# Patient Record
Sex: Male | Born: 1985 | Hispanic: No | Marital: Married | Smoking: Current every day smoker
Health system: Southern US, Community
[De-identification: ages and names within clinical notes are randomized; demographics above are authoritative.]

## PROBLEM LIST (undated history)

## (undated) DIAGNOSIS — K589 Irritable bowel syndrome without diarrhea: Secondary | ICD-10-CM

---

## 2013-02-10 ENCOUNTER — Emergency Department (HOSPITAL_COMMUNITY): Payer: Self-pay

## 2013-02-10 ENCOUNTER — Emergency Department (HOSPITAL_COMMUNITY)
Admission: EM | Admit: 2013-02-10 | Discharge: 2013-02-10 | Disposition: A | Discharge status: Home / Self Care | Payer: Self-pay | Attending: Emergency Medicine | Admitting: Emergency Medicine

## 2013-02-10 ENCOUNTER — Encounter (HOSPITAL_COMMUNITY): Payer: Self-pay | Admitting: Adult Health

## 2013-02-10 DIAGNOSIS — F17200 Nicotine dependence, unspecified, uncomplicated: Secondary | ICD-10-CM | POA: Insufficient documentation

## 2013-02-10 DIAGNOSIS — S8262XA Displaced fracture of lateral malleolus of left fibula, initial encounter for closed fracture: Secondary | ICD-10-CM

## 2013-02-10 DIAGNOSIS — X500XXA Overexertion from strenuous movement or load, initial encounter: Secondary | ICD-10-CM | POA: Insufficient documentation

## 2013-02-10 DIAGNOSIS — Y9289 Other specified places as the place of occurrence of the external cause: Secondary | ICD-10-CM | POA: Insufficient documentation

## 2013-02-10 DIAGNOSIS — Z8719 Personal history of other diseases of the digestive system: Secondary | ICD-10-CM | POA: Insufficient documentation

## 2013-02-10 DIAGNOSIS — Y9389 Activity, other specified: Secondary | ICD-10-CM | POA: Insufficient documentation

## 2013-02-10 DIAGNOSIS — S8263XA Displaced fracture of lateral malleolus of unspecified fibula, initial encounter for closed fracture: Secondary | ICD-10-CM | POA: Insufficient documentation

## 2013-02-10 HISTORY — DX: Irritable bowel syndrome without diarrhea: K58.9

## 2013-02-10 MED ORDER — HYDROCODONE-ACETAMINOPHEN 5-325 MG PO TABS: 1.0000 | ORAL_TABLET | ORAL | Status: DC | PRN

## 2013-02-10 MED ORDER — IBUPROFEN 600 MG PO TABS: 600.0000 mg | ORAL_TABLET | Freq: Four times a day (QID) | ORAL | Status: DC | PRN

## 2013-02-10 MED ORDER — HYDROCODONE-ACETAMINOPHEN 5-325 MG PO TABS
1.0000 | ORAL_TABLET | Freq: Once | ORAL | Status: AC
  Administered 2013-02-10: 1 via ORAL
  Filled 2013-02-10: qty 1

## 2013-02-10 MED ORDER — IBUPROFEN 400 MG PO TABS
800.0000 mg | ORAL_TABLET | Freq: Once | ORAL | Status: AC
  Administered 2013-02-10: 800 mg via ORAL
  Filled 2013-02-10: qty 2

## 2013-02-10 NOTE — ED Notes (Addendum)
Presents with lwft ankle injury twisted it while taking out garbage and heard a "pop" ;eft ankle edematous, CMS intact. Brisk cap refill, can wiggle digits, +2 pedal pulse. Pain is worse with movement

## 2013-02-10 NOTE — ED Notes (Signed)
Pt discharged.Vital signs stable and GCS 15 

## 2013-02-10 NOTE — Progress Notes (Signed)
Orthopedic Tech Progress Note Patient Details:  Tom Cooper 1986-06-08 [id number]  Ortho Devices Type of Ortho Device: Ace wrap;Crutches;Post (short leg) splint;Stirrup splint Ortho Device/Splint Location: left leg Ortho Device/Splint Interventions: Application   Tarl Cephas 02/10/2013, 7:53 PM

## 2013-02-10 NOTE — ED Provider Notes (Signed)
History    This chart was scribed for non-physician practitioner working with Tom Sou, MD by Sofie Rower, ED Scribe. This patient was seen in room TR10C/TR10C and the patient's care was started at 5:27PM.   CSN: [id number]  Arrival date & time 02/10/13  1648   First MD Initiated Contact with Patient 02/10/13 1727      Chief Complaint  Patient presents with  . Ankle Injury    (Consider location/radiation/quality/duration/timing/severity/associated sxs/prior treatment) The history is provided by the patient and the spouse. No language interpreter was used.    Tom Cooper is a 27 y.o. male , with a hx of IBS, who presents to the Emergency Department complaining of sudden, progressively worsening, non radiating, ankle injury, located at the left ankle, onset today (02/10/13 @ 11:00AM). The pt reports he was taking out the garbage earlier this morning when he suddenly experienced a popping, painful sensation within his left ankle. Modifying factors include certain movements and positions of the left ankle, in addition to ambulation  which intensifies the ankle pain. The pt has not taken any medications to relieve his ankle pain PTA.   The pt denies any other injuries at the present point and time.   The pt is a current everyday smoker, however, he does not drink alcohol.    Past Medical History  Diagnosis Date  . IBS (irritable bowel syndrome)     History reviewed. No pertinent past surgical history.  History reviewed. No pertinent family history.  History  Substance Use Topics  . Smoking status: Current Every Day Smoker    Types: Cigarettes  . Smokeless tobacco: Not on file  . Alcohol Use: No      Review of Systems  Musculoskeletal: Positive for arthralgias.  All other systems reviewed and are negative.    Allergies  Review of patient's allergies indicates no known allergies.  Home Medications   Current Outpatient Rx  Name  Route  Sig  Dispense  Refill  .  HYDROcodone-acetaminophen (NORCO/VICODIN) 5-325 MG per tablet   Oral   Take 1 tablet by mouth every 4 (four) hours as needed for pain.   20 tablet   0   . ibuprofen (ADVIL,MOTRIN) 600 MG tablet   Oral   Take 1 tablet (600 mg total) by mouth every 6 (six) hours as needed for pain.   30 tablet   0     BP 137/94  Pulse 110  Temp(Src) 99.2 F (37.3 C) (Oral)  Resp 16  SpO2 97%  Physical Exam  Nursing note and vitals reviewed. Constitutional: He is oriented to person, place, and time. He appears well-developed and well-nourished. No distress.  HENT:  Head: Normocephalic and atraumatic.  Eyes: EOM are normal.  Neck: Neck supple. No tracheal deviation present.  Cardiovascular: Normal rate.   Pulmonary/Chest: Effort normal. No respiratory distress.  Musculoskeletal:       Left ankle: He exhibits decreased range of motion, swelling and ecchymosis. He exhibits no deformity, no laceration and normal pulse. Tenderness. Lateral malleolus tenderness found. No medial malleolus tenderness found. Achilles tendon normal.  Neurological: He is alert and oriented to person, place, and time.  Skin: Skin is warm and dry.  Psychiatric: He has a normal mood and affect. His behavior is normal.    ED Course  Procedures (including critical care time)  DIAGNOSTIC STUDIES: Oxygen Saturation is 97% on room air, normal by my interpretation.    COORDINATION OF CARE:  5:47 PM- Treatment plan discussed with patient.  Pt agrees with treatment.   ED Medications this visit (02/10/13):  Medications  ibuprofen (ADVIL,MOTRIN) tablet 800 mg (800 mg Oral Given 02/10/13 1959)  HYDROcodone-acetaminophen (NORCO/VICODIN) 5-325 MG per tablet 1 tablet (1 tablet Oral Given 02/10/13 1959)    Other ED Orders:   02/10/13 1902  Apply splint short leg Once Discontinue Comments: With stirrups  Hervey Ard, [name] 02/10/13 1902  Crutches Once Discontinue  Ordered [name]   Labs Reviewed - No data to  display  No results found for this or any previous visit. Dg Ankle Complete Left  02/10/2013  *RADIOLOGY REPORT*  Clinical Data: Twisting ankle injury.  Lateral pain and swelling.  LEFT ANKLE COMPLETE - 3+ VIEW  Comparison: None.  Findings: Considerable abnormal soft tissue swelling overlies the lateral malleolus.  Suspected avulsion along the inferior tip of the lateral malleolus.  Tibial plafond and talar dome appear intact.  Os trigonum noted with mild irregularity along the talar side of the posterior subtalar facet, and potentially slight widening of the posterior subtalar facet.  IMPRESSION:  1.  Abnormal soft tissue swelling overlying the lateral malleolus, with suspicion for a small avulsion from the inferior tip of the lateral malleolus. 2.  Irregularity along the talar side of the posterior subtalar joint may simply be projectional, but I cannot completely exclude an injury of the posterior subtalar joint.  Consider CT or MRI for further characterization.   Original Report Authenticated By: Gaylyn Rong, M.D.    Discharge orders:    Medication List    TAKE these medications       HYDROcodone-acetaminophen 5-325 MG per tablet  Commonly known as:  NORCO/VICODIN  Take 1 tablet by mouth every 4 (four) hours as needed for pain.     ibuprofen 600 MG tablet  Commonly known as:  ADVIL,MOTRIN  Take 1 tablet (600 mg total) by mouth every 6 (six) hours as needed for pain.         1. Ankle fracture, lateral malleolus, closed, left, initial encounter       MDM    Pt has been advised of the symptoms that warrant their return to the ED. Patient has voiced understanding and has agreed to follow-up with the PCP or specialist.    I personally performed the services described in this documentation, which was scribed in my presence. The recorded information has been reviewed and is accurate.   Dorthula Matas, PA-C 02/10/13 2355

## 2013-02-11 NOTE — ED Provider Notes (Signed)
Medical screening examination/treatment/procedure(s) were performed by non-physician practitioner and as supervising physician I was immediately available for consultation/collaboration.  Doug Sou, MD 02/11/13 [phone]

## 2013-02-15 ENCOUNTER — Encounter (HOSPITAL_COMMUNITY): Payer: Self-pay | Admitting: Emergency Medicine

## 2013-02-15 ENCOUNTER — Emergency Department (HOSPITAL_COMMUNITY): Payer: Self-pay

## 2013-02-15 ENCOUNTER — Emergency Department (HOSPITAL_COMMUNITY)
Admission: EM | Admit: 2013-02-15 | Discharge: 2013-02-15 | Disposition: A | Discharge status: Home / Self Care | Payer: Self-pay | Attending: Emergency Medicine | Admitting: Emergency Medicine

## 2013-02-15 DIAGNOSIS — Z8719 Personal history of other diseases of the digestive system: Secondary | ICD-10-CM | POA: Insufficient documentation

## 2013-02-15 DIAGNOSIS — Y929 Unspecified place or not applicable: Secondary | ICD-10-CM | POA: Insufficient documentation

## 2013-02-15 DIAGNOSIS — Z8781 Personal history of (healed) traumatic fracture: Secondary | ICD-10-CM | POA: Insufficient documentation

## 2013-02-15 DIAGNOSIS — X58XXXA Exposure to other specified factors, initial encounter: Secondary | ICD-10-CM | POA: Insufficient documentation

## 2013-02-15 DIAGNOSIS — S93402D Sprain of unspecified ligament of left ankle, subsequent encounter: Secondary | ICD-10-CM

## 2013-02-15 DIAGNOSIS — F17200 Nicotine dependence, unspecified, uncomplicated: Secondary | ICD-10-CM | POA: Insufficient documentation

## 2013-02-15 DIAGNOSIS — S93409A Sprain of unspecified ligament of unspecified ankle, initial encounter: Secondary | ICD-10-CM | POA: Insufficient documentation

## 2013-02-15 DIAGNOSIS — Y939 Activity, unspecified: Secondary | ICD-10-CM | POA: Insufficient documentation

## 2013-02-15 NOTE — ED Notes (Signed)
Patient states that he came into the ED on Wednesday for the same.  Patient was referred out to an orthopedic MD, but the referred MD's office advised the patient that they could not see patient without a $250.00 deposit.   Patient called back to ED and asked what he needed to do to follow up.  He was advised to come back to the ED.

## 2013-02-15 NOTE — ED Provider Notes (Signed)
History     CSN: [id number]  Arrival date & time 02/15/13  1251   First MD Initiated Contact with Patient 02/15/13 1307      Chief Complaint  Patient presents with  . Ankle Pain    (Consider location/radiation/quality/duration/timing/severity/associated sxs/prior treatment) HPI Comments: Patient presents today for follow up.  He was diagnosed with a possible avulsion fracture of the left lateral malleolus on 02/10/13.  He has a posterior splint placed at that time and was given referral to the Orthopedist on call.  Patient reports that he attempted to call the Orthopedist on call, but was told that he would required to pay $250 up front, which he does not have.  He denies any pain at this time.  He denies numbness or tingling.  He has not taken anything for pain recently.  Patient is a 27 y.o. male presenting with ankle pain. The history is provided by the patient.  Ankle Pain   Past Medical History  Diagnosis Date  . IBS (irritable bowel syndrome)     History reviewed. No pertinent past surgical history.  History reviewed. No pertinent family history.  History  Substance Use Topics  . Smoking status: Current Every Day Smoker    Types: Cigarettes  . Smokeless tobacco: Not on file  . Alcohol Use: No      Review of Systems  Musculoskeletal:       Denies ankle pain at this time.  Neurological: Negative for numbness.    Allergies  Review of patient's allergies indicates no known allergies.  Home Medications   Current Outpatient Rx  Name  Route  Sig  Dispense  Refill  . ibuprofen (ADVIL,MOTRIN) 200 MG tablet   Oral   Take 400 mg by mouth every 6 (six) hours as needed for pain.         Marland Kitchen HYDROcodone-acetaminophen (NORCO/VICODIN) 5-325 MG per tablet   Oral   Take 1 tablet by mouth every 4 (four) hours as needed for pain.   20 tablet   0   . ibuprofen (ADVIL,MOTRIN) 600 MG tablet   Oral   Take 1 tablet (600 mg total) by mouth every 6 (six) hours as needed for  pain.   30 tablet   0     BP 143/88  Pulse 107  Temp(Src) 98.5 F (36.9 C) (Oral)  Resp 18  SpO2 98%  Physical Exam  Nursing note and vitals reviewed. Constitutional: He appears well-developed and well-nourished.  HENT:  Head: Normocephalic and atraumatic.  Neck: Normal range of motion. Neck supple.  Cardiovascular: Normal rate, regular rhythm and normal heart sounds.   Pulmonary/Chest: Effort normal and breath sounds normal.  Musculoskeletal:  Posterior splint of the left lower leg appears to be intact.    Neurological: He is alert.  Distal sensation of the left toes intact.  Skin: Skin is warm and dry.  Psychiatric: He has a normal mood and affect.    ED Course  Procedures (including critical care time)  Labs Reviewed - No data to display Dg Ankle Complete Left  02/15/2013  *RADIOLOGY REPORT*  Clinical Data: Left ankle pain  LEFT ANKLE COMPLETE - 3+ VIEW  Comparison: 02/10/2013  Findings: Three views of the left ankle submitted.  Study is limited by casting material artifact.  No visible fracture or subluxation.  Ankle mortise is preserved.  IMPRESSION: Limited study by casting material artifact.  Ankle mortise is preserved.  No visible fracture or subluxation.   Original Report Authenticated By: Lang Snow  Pop, M.D.      No diagnosis found.   Patient discussed with Dr. Ranae Palms. He recommends ordering another xray and reassessing  Discussed xray results with Dr. Ranae Palms.  He recommends removing the splint.  Ankle reexamined after splint removal.  He has full ROM of the ankle.  No swelling or obvious deformity.  Patient able to ambulate on left foot without pain after splint removed.  MDM  Patient presenting due to inability to follow up with the Orthopedist.  He was diagnosed with an avulsion fracture of the left ankle five days ago.  Posterior splint with stirrups was applied at that time and patient given referral to Orthopedics.  He was unable to follow up with  orthopedist for financial reasons.  He is not complaining of any pain at this time.  Splint removed and patient was able to ambulate without pain.  Therefore, feel that patient is stable for discharge.        Pascal Lux Britton, PA-C 02/16/13 [phone]

## 2013-02-15 NOTE — ED Notes (Signed)
Pt here after having fracture in left ankle last Wednesday; pt sts told to follow up with ortho but ortho referral said they would not see pt without money first and they did not have money so came back here for recheck

## 2013-02-15 NOTE — Progress Notes (Signed)
Orthopedic Tech Progress Note Patient Details:  Tom Cooper Jan 26, 1986 [id number]  Ortho Devices Type of Ortho Device: ASO Ortho Device/Splint Location: left ankle Ortho Device/Splint Interventions: Application   Laurel Harnden 02/15/2013, 4:19 PM

## 2013-02-17 NOTE — ED Provider Notes (Signed)
Medical screening examination/treatment/procedure(s) were performed by non-physician practitioner and as supervising physician I was immediately available for consultation/collaboration.   Loren Racer, MD 02/17/13 [phone]

## 2013-11-22 ENCOUNTER — Emergency Department (HOSPITAL_COMMUNITY): Payer: Self-pay

## 2013-11-22 ENCOUNTER — Encounter (HOSPITAL_COMMUNITY): Payer: Self-pay | Admitting: Emergency Medicine

## 2013-11-22 ENCOUNTER — Emergency Department (HOSPITAL_COMMUNITY)
Admission: EM | Admit: 2013-11-22 | Discharge: 2013-11-22 | Disposition: A | Discharge status: Home / Self Care | Payer: Self-pay | Attending: Emergency Medicine | Admitting: Emergency Medicine

## 2013-11-22 DIAGNOSIS — Z8719 Personal history of other diseases of the digestive system: Secondary | ICD-10-CM | POA: Insufficient documentation

## 2013-11-22 DIAGNOSIS — F17200 Nicotine dependence, unspecified, uncomplicated: Secondary | ICD-10-CM | POA: Insufficient documentation

## 2013-11-22 DIAGNOSIS — R0789 Other chest pain: Secondary | ICD-10-CM | POA: Insufficient documentation

## 2013-11-22 DIAGNOSIS — J209 Acute bronchitis, unspecified: Secondary | ICD-10-CM

## 2013-11-22 DIAGNOSIS — R51 Headache: Secondary | ICD-10-CM | POA: Insufficient documentation

## 2013-11-22 DIAGNOSIS — J40 Bronchitis, not specified as acute or chronic: Secondary | ICD-10-CM | POA: Insufficient documentation

## 2013-11-22 LAB — CBC WITH DIFFERENTIAL/PLATELET
BASOS PCT: 1 % (ref 0–1)
Basophils Absolute: 0.1 10*3/uL (ref 0.0–0.1)
EOS ABS: 0.3 10*3/uL (ref 0.0–0.7)
Eosinophils Relative: 4 % (ref 0–5)
HEMATOCRIT: 46.1 % (ref 39.0–52.0)
HEMOGLOBIN: 16.1 g/dL (ref 13.0–17.0)
LYMPHS ABS: 2.3 10*3/uL (ref 0.7–4.0)
Lymphocytes Relative: 31 % (ref 12–46)
MCH: 31 pg (ref 26.0–34.0)
MCHC: 34.9 g/dL (ref 30.0–36.0)
MCV: 88.7 fL (ref 78.0–100.0)
MONO ABS: 0.4 10*3/uL (ref 0.1–1.0)
MONOS PCT: 5 % (ref 3–12)
NEUTROS ABS: 4.5 10*3/uL (ref 1.7–7.7)
Neutrophils Relative %: 60 % (ref 43–77)
Platelets: 212 10*3/uL (ref 150–400)
RBC: 5.2 MIL/uL (ref 4.22–5.81)
RDW: 12 % (ref 11.5–15.5)
WBC: 7.6 10*3/uL (ref 4.0–10.5)

## 2013-11-22 LAB — BASIC METABOLIC PANEL
BUN: 14 mg/dL (ref 6–23)
CHLORIDE: 105 meq/L (ref 96–112)
CO2: 24 mEq/L (ref 19–32)
Calcium: 9.1 mg/dL (ref 8.4–10.5)
Creatinine, Ser: 0.62 mg/dL (ref 0.50–1.35)
GFR calc Af Amer: >90 mL/min (ref >90)
GFR calc non Af Amer: >90 mL/min (ref >90)
GLUCOSE: 101 mg/dL — AB (ref 70–99)
POTASSIUM: 4.4 meq/L (ref 3.7–5.3)
Sodium: 141 mEq/L (ref 137–147)

## 2013-11-22 LAB — RAPID STREP SCREEN (MED CTR MEBANE ONLY): Streptococcus, Group A Screen (Direct): NEGATIVE

## 2013-11-22 MED ORDER — METHYLPREDNISOLONE SODIUM SUCC 125 MG IJ SOLR
125.0000 mg | Freq: Once | INTRAMUSCULAR | Status: AC
  Administered 2013-11-22: 125 mg via INTRAVENOUS
  Filled 2013-11-22: qty 2

## 2013-11-22 MED ORDER — ALBUTEROL SULFATE HFA 108 (90 BASE) MCG/ACT IN AERS
1.0000 | INHALATION_SPRAY | RESPIRATORY_TRACT | Status: DC | PRN
  Administered 2013-11-22: 2 via RESPIRATORY_TRACT
  Filled 2013-11-22: qty 6.7

## 2013-11-22 MED ORDER — ALBUTEROL SULFATE (2.5 MG/3ML) 0.083% IN NEBU
5.0000 mg | INHALATION_SOLUTION | RESPIRATORY_TRACT | Status: DC | PRN
  Administered 2013-11-22: 5 mg via RESPIRATORY_TRACT
  Filled 2013-11-22: qty 6

## 2013-11-22 MED ORDER — PREDNISONE 50 MG PO TABS: 50.0000 mg | ORAL_TABLET | Freq: Every day | ORAL | Status: AC

## 2013-11-22 MED ORDER — IPRATROPIUM BROMIDE 0.02 % IN SOLN
0.5000 mg | Freq: Once | RESPIRATORY_TRACT | Status: AC
  Administered 2013-11-22: 0.5 mg via RESPIRATORY_TRACT
  Filled 2013-11-22: qty 2.5

## 2013-11-22 NOTE — ED Notes (Signed)
Pt. Is not noted in the room. 

## 2013-11-22 NOTE — Discharge Planning (Signed)
[facility] Felicia E, Community Liaison ° °Spoke to patient about primary care resources, resource guide given. °

## 2013-11-22 NOTE — ED Notes (Signed)
Pt. Is gone to xray.

## 2013-11-22 NOTE — Discharge Instructions (Signed)
Antibiotic Nonuse ° Your caregiver felt that the infection or problem was not one that would be helped with an antibiotic. °Infections may be caused by viruses or bacteria. Only a caregiver can tell which one of these is the likely cause of an illness. A cold is the most common cause of infection in both adults and children. A cold is a virus. Antibiotic treatment will have no effect on a viral infection. Viruses can lead to many lost days of work caring for sick children and many missed days of school. Children may catch as many as 10 "colds" or "flus" per year during which they can be tearful, cranky, and uncomfortable. The goal of treating a virus is aimed at keeping the ill person comfortable. °Antibiotics are medications used to help the body fight bacterial infections. There are relatively few types of bacteria that cause infections but there are hundreds of viruses. While both viruses and bacteria cause infection they are very different types of germs. A viral infection will typically go away by itself within 7 to 10 days. Bacterial infections may spread or get worse without antibiotic treatment. °Examples of bacterial infections are: °· Sore throats (like strep throat or tonsillitis). °· Infection in the lung (pneumonia). °· Ear and skin infections. °Examples of viral infections are: °· Colds or flus. °· Most coughs and bronchitis. °· Sore throats not caused by Strep. °· Runny noses. °It is often best not to take an antibiotic when a viral infection is the cause of the problem. Antibiotics can kill off the helpful bacteria that we have inside our body and allow harmful bacteria to start growing. Antibiotics can cause side effects such as allergies, nausea, and diarrhea without helping to improve the symptoms of the viral infection. Additionally, repeated uses of antibiotics can cause bacteria inside of our body to become resistant. That resistance can be passed onto harmful bacterial. The next time you have  an infection it may be harder to treat if antibiotics are used when they are not needed. Not treating with antibiotics allows our own immune system to develop and take care of infections more efficiently. Also, antibiotics will work better for us when they are prescribed for bacterial infections. °Treatments for a child that is ill may include: °· Give extra fluids throughout the day to stay hydrated. °· Get plenty of rest. °· Only give your child over-the-counter or prescription medicines for pain, discomfort, or fever as directed by your caregiver. °· The use of a cool mist humidifier may help stuffy noses. °· Cold medications if suggested by your caregiver. °Your caregiver may decide to start you on an antibiotic if: °· The problem you were seen for today continues for a longer length of time than expected. °· You develop a secondary bacterial infection. °SEEK MEDICAL CARE IF: °· Fever lasts longer than 5 days. °· Symptoms continue to get worse after 5 to 7 days or become severe. °· Difficulty in breathing develops. °· Signs of dehydration develop (poor drinking, rare urinating, dark colored urine). °· Changes in behavior or worsening tiredness (listlessness or lethargy). °Document Released: 12/08/2001 Document Revised: 12/22/2011 Document Reviewed: 06/06/2009 °ExitCare® Patient Information ©2014 [employer]. ° °Bronchitis °Bronchitis is inflammation of the airways that extend from the windpipe into the lungs (bronchi). The inflammation often causes mucus to develop, which leads to a cough. If the inflammation becomes severe, it may cause shortness of breath. °CAUSES  °Bronchitis may be caused by:  °· Viral infections.   °· Bacteria.   °·   Cigarette smoke.   Allergens, pollutants, and other irritants.  SIGNS AND SYMPTOMS  The most common symptom of bronchitis is a frequent cough that produces mucus. Other symptoms include:  Fever.   Body aches.   Chest congestion.   Chills.   Shortness of  breath.   Sore throat.  DIAGNOSIS  Bronchitis is usually diagnosed through a medical history and physical exam. Tests, such as chest X-rays, are sometimes done to rule out other conditions.  TREATMENT  You may need to avoid contact with whatever caused the problem (smoking, for example). Medicines are sometimes needed. These may include:  Antibiotics. These may be prescribed if the condition is caused by bacteria.  Cough suppressants. These may be prescribed for relief of cough symptoms.   Inhaled medicines. These may be prescribed to help open your airways and make it easier for you to breathe.   Steroid medicines. These may be prescribed for those with recurrent (chronic) bronchitis. HOME CARE INSTRUCTIONS  Get plenty of rest.   Drink enough fluids to keep your urine clear or pale yellow (unless you have a medical condition that requires fluid restriction). Increasing fluids may help thin your secretions and will prevent dehydration.   Only take over-the-counter or prescription medicines as directed by your health care provider.  Only take antibiotics as directed. Make sure you finish them even if you start to feel better.  Avoid secondhand smoke, irritating chemicals, and strong fumes. These will make bronchitis worse. If you are a smoker, quit smoking. Consider using nicotine gum or skin patches to help control withdrawal symptoms. Quitting smoking will help your lungs heal faster.   Put a cool-mist humidifier in your bedroom at night to moisten the air. This may help loosen mucus. Change the water in the humidifier daily. You can also run the hot water in your shower and sit in the bathroom with the door closed for 5 10 minutes.   Follow up with your health care provider as directed.   Wash your hands frequently to avoid catching bronchitis again or spreading an infection to others.  SEEK MEDICAL CARE IF: Your symptoms do not improve after 1 week of treatment.  SEEK  IMMEDIATE MEDICAL CARE IF:  Your fever increases.  You have chills.   You have chest pain.   You have worsening shortness of breath.   You have bloody sputum.  You faint.  You have lightheadedness.  You have a severe headache.   You vomit repeatedly. MAKE SURE YOU:   Understand these instructions.  Will watch your condition.  Will get help right away if you are not doing well or get worse. Document Released: 09/29/2005 Document Revised: 07/20/2013 Document Reviewed: 05/24/2013 [facility] Patient Information 2014 [place], [place].   Emergency Department Resource Guide 1) Find a Doctor and Pay Out of Pocket Although you won't have to find out who is covered by your insurance plan, it is a good idea to ask around and get recommendations. You will then need to call the office and see if the doctor you have chosen will accept you as a new patient and what types of options they offer for patients who are self-pay. Some doctors offer discounts or will set up payment plans for their patients who do not have insurance, but you will need to ask so you aren't surprised when you get to your appointment.  2) Contact Your Local Health Department Not all health departments have doctors that can see patients for sick visits, but many  do, so it is worth a call to see if yours does. If you don't know where your local health department is, you can check in your phone book. The CDC also has a tool to help you locate your state's health department, and many state websites also have listings of all of their local health departments.  3) Find a Walk-in Clinic If your illness is not likely to be very severe or complicated, you may want to try a walk in clinic. These are popping up all over the country in pharmacies, drugstores, and shopping centers. They're usually staffed by nurse practitioners or physician assistants that have been trained to treat common illnesses and complaints. They're usually  fairly quick and inexpensive. However, if you have serious medical issues or chronic medical problems, these are probably not your best option.  No Primary Care Doctor: - Call Health Connect at  [phone] - they can help you locate a primary care doctor that  accepts your insurance, provides certain services, etc. - Physician Referral Service- [phone]  Chronic Pain Problems: Organization         Address  Phone   Notes  [facility]  [phone] Patients need to be referred by their primary care doctor.   Medication Assistance: Organization         Address  Phone   Notes  [facility] Medication [facility] [address] [place], [place] [address] [phone] --Must be a resident of [facility] -- Must have NO insurance coverage whatsoever (no Medicaid/ Medicare, etc.) -- The pt. MUST have a primary care doctor that directs their care regularly and follows them in the community   [facility]  [phone]   [employer]  [phone]    Agencies that provide inexpensive medical care: Organization         Address  Phone   Notes  [facility]  [phone]   [facility]    [phone]   [facility] [address] [place], [place] [address] [phone]   [facility] [address] [place] [place] [phone]   Planned Parenthood    [phone]   [facility]    [phone]   [facility]  [address], [place] Phone:  [phone], Fax:  [phone] Hours of Operation:  9 am - 6 pm, M-F.  Also accepts Medicaid/Medicare and self-pay.  [facility]  [address], [place] Phone: [phone], Fax: [phone]. Hours of Operation:  8:30 am - 5:30 pm, M-F.  Also accepts Medicaid and self-pay.  [facility] [place] [address], [place] Phone: [phone]   [facility] [address] Natasha Bence [place], [place] [phone], Ext. [phone] Mondays & Thursdays: 7-9 AM.  First 15 patients are seen on a first come, first serve basis.    Medicaid-accepting [facility] Providers:  Organization         Address  Phone   Notes  [facility] [address],  [phone] Also accepts self-pay patients.  [facility] [address] Laurell Josephs [place], [place]  [phone]   [facility] [address], [place] [phone]   [facility] [address] [place] [phone]   Renaye Rakers  [address], [place]   [phone] Only accepts [place] patients after they have their name applied to their card.   Self-Pay (no insurance) in [facility]:  Organization         Address  Phone   Notes  Sickle Cell Patients, [facility] [address] [place], [place] [phone]   [facility] [address] [place], [place] [phone]   [facility] [place]  [address], [place] [phone]   [facility]/Dr. Osei-Bonsu  [address] [place] or [address], [place] [phone] Phone number for both [place] and [place] locations is the same.  [facility] [address], [place] [phone]   [facility] [address], [place] or [address] [phone]   [facility] [address], [place] [phone], phone; [phone], fax Sees patients 1st and 3rd Saturday of every month.  Must not qualify for public or private insurance (i.e. Medicaid, Medicare, [place] Health Choice, Veterans' Benefits)  Household income should be no more than 200% of the poverty level The clinic cannot treat you if  you are pregnant or think you are pregnant  Sexually transmitted diseases are not treated at the clinic.   Dental Care: Organization         Address  Phone  Notes  [facility] Department of [facility] [address] Ig[place]ia [place], [place] [phone] Accepts children up to age 34 who are enrolled in [place] or [place] Health Choice; pregnant women with a Medicaid card; and children who have applied for Medicaid or [place] Health Choice, but were declined, whose parents can pay a reduced fee at time of service.  [facility] Department of [facility]  [address], [place] [phone] Accepts children up to age 6 who are enrolled in [place] or [place] Health Choice; pregnant women with a Medicaid card; and children who have applied for Medicaid or [place] Health Choice, but were declined, whose parents can pay a reduced fee at time of service.  [facility]  [address] [place], [place] [phone] Patients are seen by appointment only. Walk-ins are not accepted. [facility] will see patients 25 years of age and older. Monday - Tuesday (8am-5pm) Most Wednesdays (8:30-5pm) $30 per visit, cash only  [facility] Adult Dental Access [facility]  [address], [facility] [phone] Patients are seen by appointment only. Walk-ins are not accepted. [facility] will see patients 95 years of age and older. One Wednesday Evening (Monthly: Volunteer Based).  $30 per visit, cash only  [employer]  [phone] for adults; Children under age 68, call Graduate Pediatric Dentistry at [phone]. Children aged 41-14, please call [phone] to request a pediatric application.  Dental services are provided in all areas of dental care including fillings, crowns and bridges, complete and partial dentures, implants, gum treatment, root canals, and extractions. Preventive care is also provided. Treatment is  provided to both adults and children. Patients are selected via a lottery and there is often a waiting list.   [facility] [address] [place]  [phone] [email or web address]   [facility] [address] [place], [place] [phone], Ext. [phone] Second and  Fourth Thursday of each month, opens at 6:30 AM; Clinic ends at 9 AM.  Patients are seen on a first-come first-served basis, and a limited number are seen during each clinic.   [facility]  [address] Ether Griffins [place], [place] [phone]   Eligibility Requirements You must have lived in [place], [place], or [place] counties for at least the last three months.   You cannot be eligible for state or federal sponsored [place], including [employer], [place], or [employer].   You generally cannot be eligible for healthcare insurance through your employer.    How to apply: Eligibility screenings are held every Tuesday and Wednesday afternoon from 1:00 pm until 4:00 pm. You do not need an appointment for the interview!  [facility] [address] [place], [place] [phone]   [facility] Health Department  [phone]   [facility] Health Department  [phone]   [facility] Health Department  [phone]    Behavioral Health Resources in the Community: Intensive Outpatient Programs Organization         Address  Phone  Notes  [facility] [address], [place], [place] [phone]   [facility] Outpatient [address] [place], [place] [phone]   [facility] [address] [place], [place]  [phone]   [facility] Mental Health [address]  [place], [place] [phone] or [phone]   Substance Abuse Resources Organization         Address  Phone  Notes  [facility]  [phone]   [facility]  [phone]   The  [place]  [phone]   Floydene Flock  [phone]   Residential & Outpatient Substance Abuse Program  [phone]   Psychological Services Organization         Address  Phone  Notes  [facility] Behavioral Health  [phone]   [facility]  [phone]   [facility] Health [address], [place] [phone] or [phone]    Mobile Crisis Teams Organization         Address  Phone  Notes  [facility], Mobile Crisis Care Unit  [phone]   [facility]  [address] [place], [place] [phone]   Doristine Locks [address] [place] [phone]    Self-Help/Support Groups Organization         Address  Phone             Notes  [facility] [place] - variety of support groups  [phone]- [phone] Call for more information  Narcotics Anonymous (NA), [facility] [address], [employer] [place]  2 meetings at this location   Statistician         Address  Phone  Notes  [facility] [address],    [place]  [phone]   [facility]  [address] [place] [address], [place], [place] [phone]   [facility] [address] [place], [place] [phone] Admissions: 8am-3pm M-F  [facility] [address]    [place], [place] [phone]   [facility] [address] Starling Manns [place], [place] [phone]   [facility] [address],  [place], [place] [phone]   [facility] - Intensive Outpatient [address] Laurell Josephs [address], [place], [place] [phone]   [facility] ([facility] [address]  [place], [place] [phone] or [phone]   [facility] ([facility]) [address] [place], [place] [phone] Accepts Medicaid  [facility] [address]  [place] [phone] Substance Abuse/Addiction Treatment     [facility] Behavioral Health Resources Organization         Address  Phone  Notes  [facility]  [phone]   Angie FavaJulie Brannon, PhD [address] [place], [place]   [phone](336) [phone] or [phone]) [phone]   [facility]   [address] [place], [place] [phone]) [phone]   [facility] [address], [place], [place] [phone] Insurance/Medicaid/sponsorship through [facility]  [facility] [address]                                    [place], [place] [phone] Therapy/tele-psych/case  [facility] [address]   [place], [place] [phone]) [phone]    Dr. Lolly MustacheArfeen  [phone](336) [phone]   [facility] [place]  [employer] [facility] 1) [address],  2) [address], [place] 3)  [address], [place] [phone]) [phone]) [phone]  [phone]) [phone]   [facility] Child Abuse Hotline [phone]) [phone] or [phone](336) [phone] (After Hours)

## 2013-11-22 NOTE — ED Provider Notes (Signed)
CSN: [id number]     Arrival date & time 11/22/13  1122 History   First MD Initiated Contact with Patient 11/22/13 1142     Chief Complaint  Patient presents with  . Shortness of Breath      HPI Pt states last night he started feeling pressure in his chest and sore throat.  Gradually worse over the day. He has felt short of breath and feels like his is having swelling in his throat.  Cough is productinve.  He does smoke.  His throat is sore.  No fevers.  No rash, no hives, no itching.  No new foods.  Past Medical History  Diagnosis Date  . IBS (irritable bowel syndrome)    History reviewed. No pertinent past surgical history. History reviewed. No pertinent family history. History  Substance Use Topics  . Smoking status: Current Every Day Smoker    Types: Cigarettes  . Smokeless tobacco: Not on file  . Alcohol Use: No    Review of Systems  Constitutional: Negative for fever.  Respiratory: Positive for cough and shortness of breath.   Gastrointestinal: Negative for abdominal pain.  Neurological: Positive for headaches.  All other systems reviewed and are negative.      Allergies  Review of patient's allergies indicates no known allergies.  Home Medications   Current Outpatient Rx  Name  Route  Sig  Dispense  Refill  . ibuprofen (ADVIL,MOTRIN) 200 MG tablet   Oral   Take 400 mg by mouth every 6 (six) hours as needed for pain.         . Pseudoeph-Doxylamine-DM-APAP (NYQUIL PO)   Oral   Take 15 mLs by mouth at bedtime as needed (cold).         . predniSONE (DELTASONE) 50 MG tablet   Oral   Take 1 tablet (50 mg total) by mouth daily.   5 tablet   0     Dispense as written.    BP 134/96  Pulse 107  Temp(Src) 97.9 F (36.6 C)  Resp 20  Ht 5\' 9"  (1.753 m)  Wt 210 lb (95.255 kg)  BMI 31.00 kg/m2  SpO2 89% Physical Exam  Nursing note and vitals reviewed. Constitutional: He appears well-developed and well-nourished. No distress.  HENT:  Head:  Normocephalic and atraumatic.  Right Ear: External ear normal.  Left Ear: External ear normal.  Mouth/Throat: No trismus in the jaw. No uvula swelling. Posterior oropharyngeal erythema present. No oropharyngeal exudate or tonsillar abscesses.  Erythema posterior pharynx  Eyes: Conjunctivae are normal. Right eye exhibits no discharge. Left eye exhibits no discharge. No scleral icterus.  Neck: Normal range of motion. Neck supple. No muscular tenderness present. No rigidity. No tracheal deviation, no edema, no erythema and normal range of motion present.  No swelling or edema  Cardiovascular: Normal rate, regular rhythm and intact distal pulses.   Pulmonary/Chest: Effort normal. No stridor. No respiratory distress. He has wheezes. He has no rales.  Abdominal: Soft. Bowel sounds are normal. He exhibits no distension. There is no tenderness. There is no rebound and no guarding.  Musculoskeletal: He exhibits no edema and no tenderness.  Neurological: He is alert. He has normal strength. No cranial nerve deficit (no facial droop, extraocular movements intact, no slurred speech) or sensory deficit. He exhibits normal muscle tone. He displays no seizure activity. Coordination normal.  Skin: Skin is warm and dry. No rash noted.  Psychiatric: He has a normal mood and affect.    ED Course  Procedures (including critical care time) Labs Review Labs Reviewed  BASIC METABOLIC PANEL - Abnormal; Notable for the following:    Glucose, Bld 101 (*)    All other components within normal limits  RAPID STREP SCREEN  CULTURE, GROUP A STREP  CBC WITH DIFFERENTIAL   Imaging Review Dg Chest 2 View  11/22/2013   CLINICAL DATA:  Cough, shortness of Breath  EXAM: CHEST  2 VIEW  COMPARISON:  None.  FINDINGS: Cardiomediastinal silhouette is unremarkable. No acute infiltrate or pleural effusion. No pulmonary edema. Mild perihilar bronchitic changes.  IMPRESSION: No acute infiltrate or pulmonary edema.  Mild bronchitic  changes.   Electronically Signed   By: Natasha Mead M.D.   On: 11/22/2013 12:03    EKG Interpretation    Date/Time:  Tuesday November 22 2013 11:32:02 EST Ventricular Rate:  103 PR Interval:  142 QRS Duration: 80 QT Interval:  332 QTC Calculation: 434 R Axis:   80 Text Interpretation:  Sinus tachycardia Otherwise normal ECG No previous tracing Confirmed by Nicosha Struve  MD-J, Bhavya Grand ([id number]) on 11/22/2013 11:42:29 AM          1411  Very slight wheeze on end expiration.   No distress, improved in the ED  MDM   Final diagnoses:  Bronchitis with bronchospasm    No pneumonia or pneumothorax noted on chest x-ray. I suspect the patient's symptoms are related to bronchitis with bronchospasm associated with his cigarette smoking.  He does complain of sore throat and swelling however objectively on exam there is no evidence of edema or swelling in his neck or posterior pharynx. There is erythema and I suspect he is having some symptoms related to a viral inflammation.    Celene Kras, MD 11/22/13 [phone]

## 2013-11-22 NOTE — ED Notes (Signed)
Per pt since last night has felt pressure in his chest and SOB. sts more pressure after he eats. sts that he feels like his throat is sore and  is closing up. sts productive cough. Pt hx of smoking.

## 2013-11-24 LAB — CULTURE, GROUP A STREP

## 2015-11-28 IMAGING — CR DG CHEST 2V
2 series · 2 of 2 positions shown · non-contrast
Comparison: None.

CLINICAL DATA: Cough, shortness of Breath

EXAM:
CHEST  2 VIEW

[w chest pa]
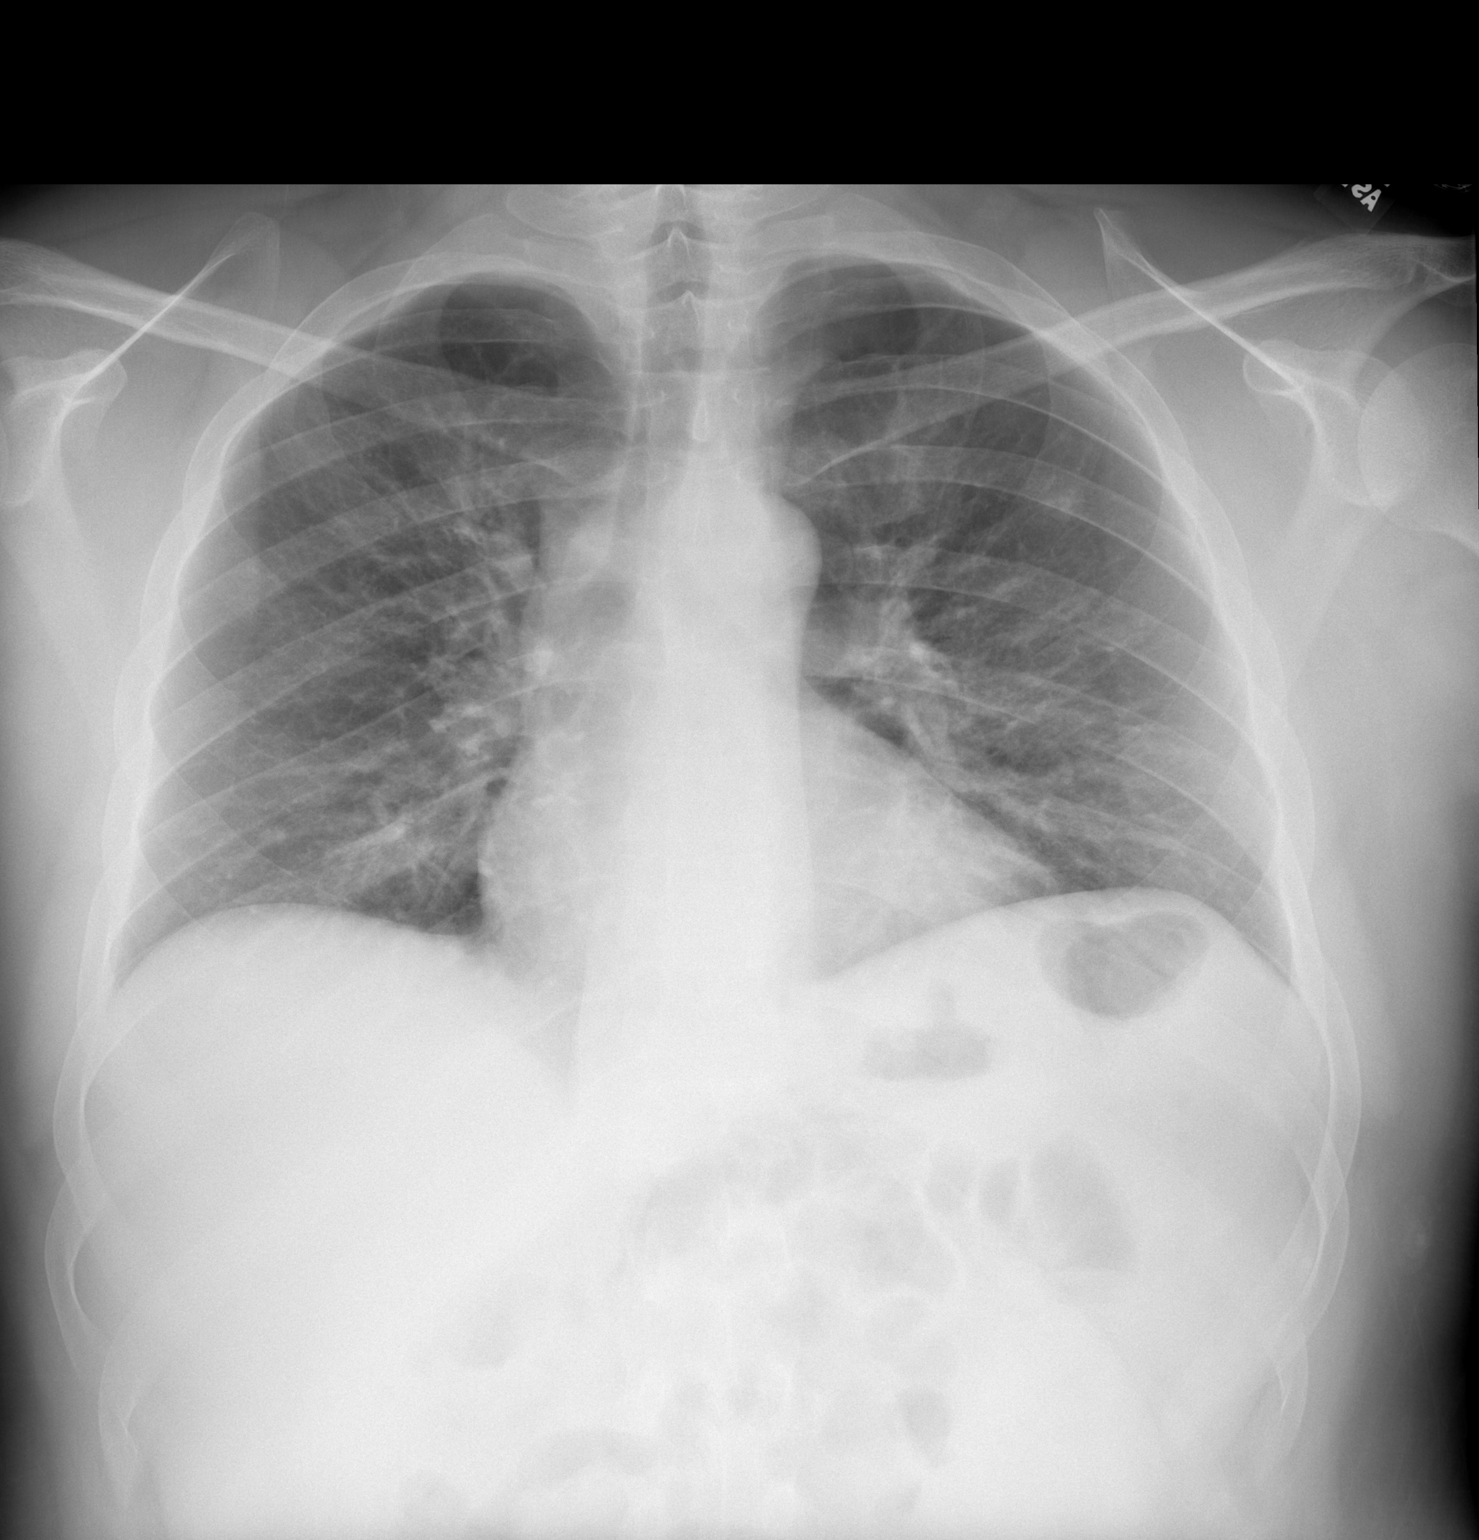

[w chest lat]
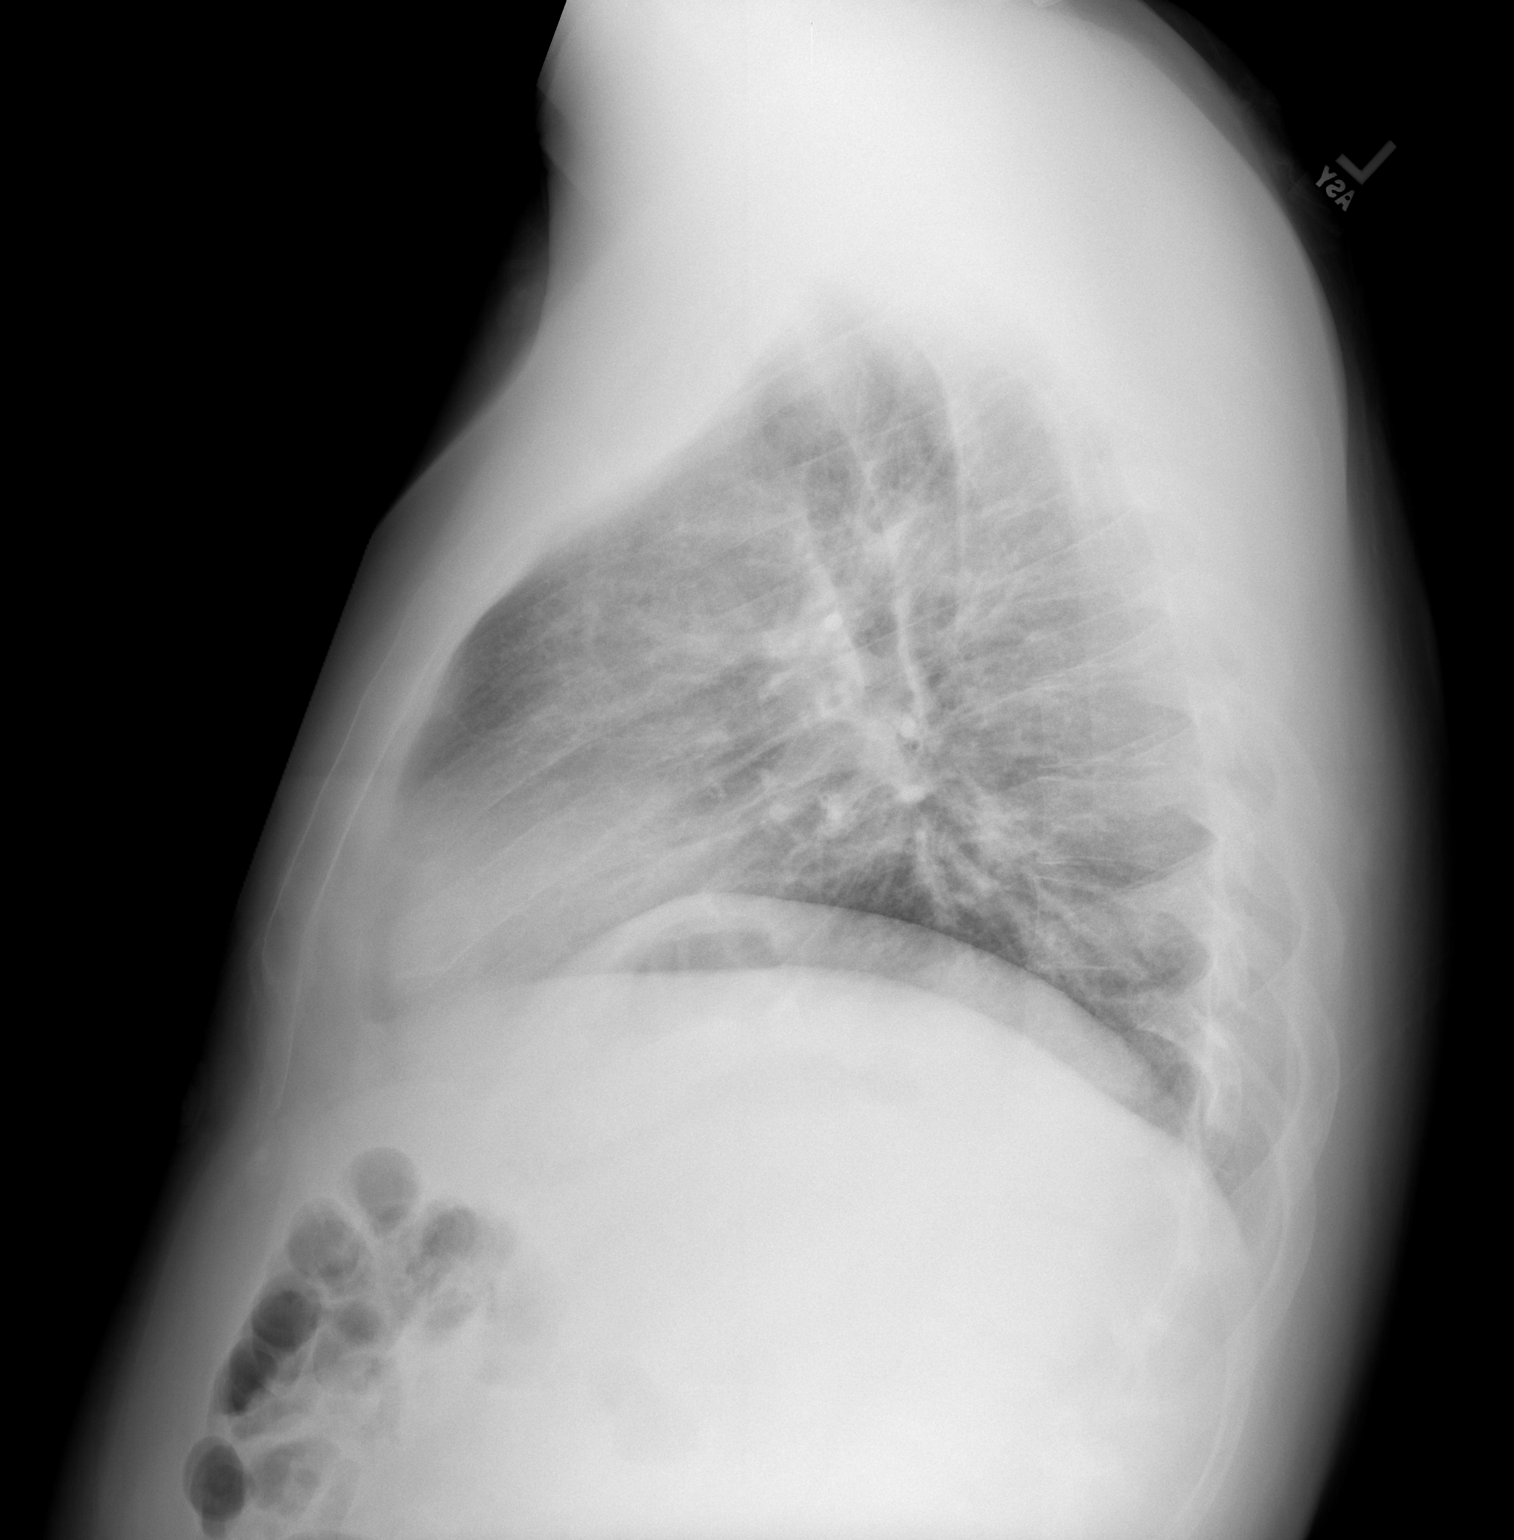

[2 of 2 positions shown; findings below may reference images not displayed]

FINDINGS: Cardiomediastinal silhouette is unremarkable. No acute infiltrate or
pleural effusion. No pulmonary edema. Mild perihilar bronchitic
changes.
IMPRESSION: No acute infiltrate or pulmonary edema.  Mild bronchitic changes.
